# Patient Record
Sex: Male | Born: 2000 | Race: Black or African American | Hispanic: No | Marital: Single | Smoking: Never smoker
Health system: Southern US, Community
[De-identification: ages and names within clinical notes are randomized; demographics above are authoritative.]

## PROBLEM LIST (undated history)

## (undated) DIAGNOSIS — Q423 Congenital absence, atresia and stenosis of anus without fistula: Secondary | ICD-10-CM

## (undated) DIAGNOSIS — F909 Attention-deficit hyperactivity disorder, unspecified type: Secondary | ICD-10-CM

## (undated) DIAGNOSIS — L309 Dermatitis, unspecified: Secondary | ICD-10-CM

## (undated) HISTORY — PX: ANUS SURGERY: SHX302

---
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
## 2022-07-25 IMAGING — DX DG SHOULDER 2+V*L*
4 series · 4 of 4 positions shown · non-contrast
Comparison: None.

CLINICAL DATA: Intermittent left shoulder pain

EXAM:
LEFT SHOULDER - 2+ VIEW

[shoulder grashey]
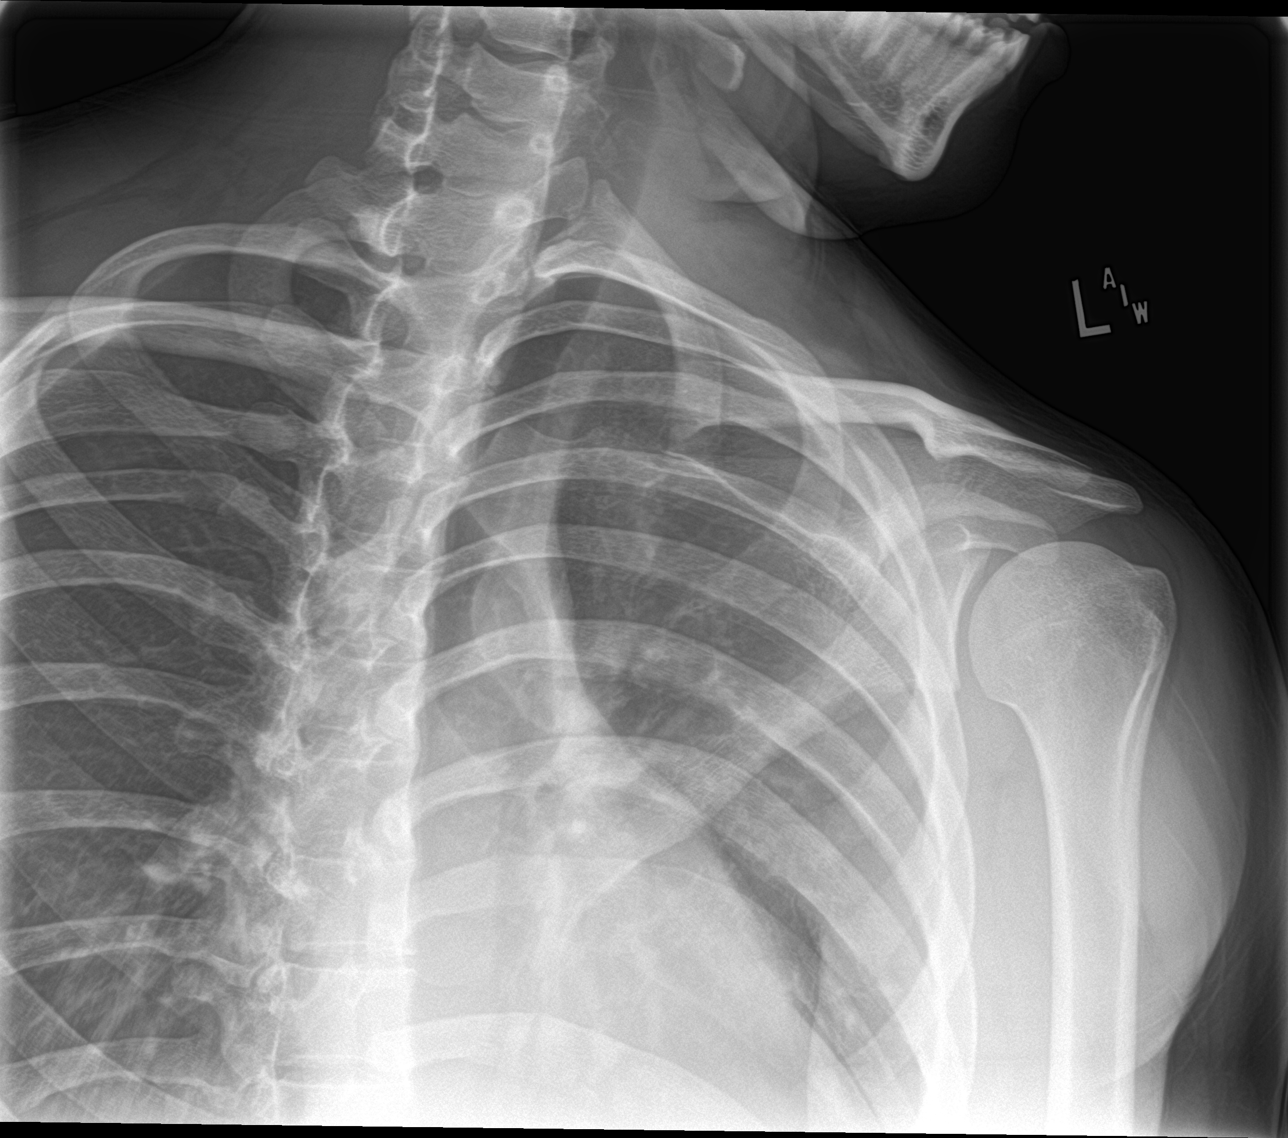

[shoulder y view]
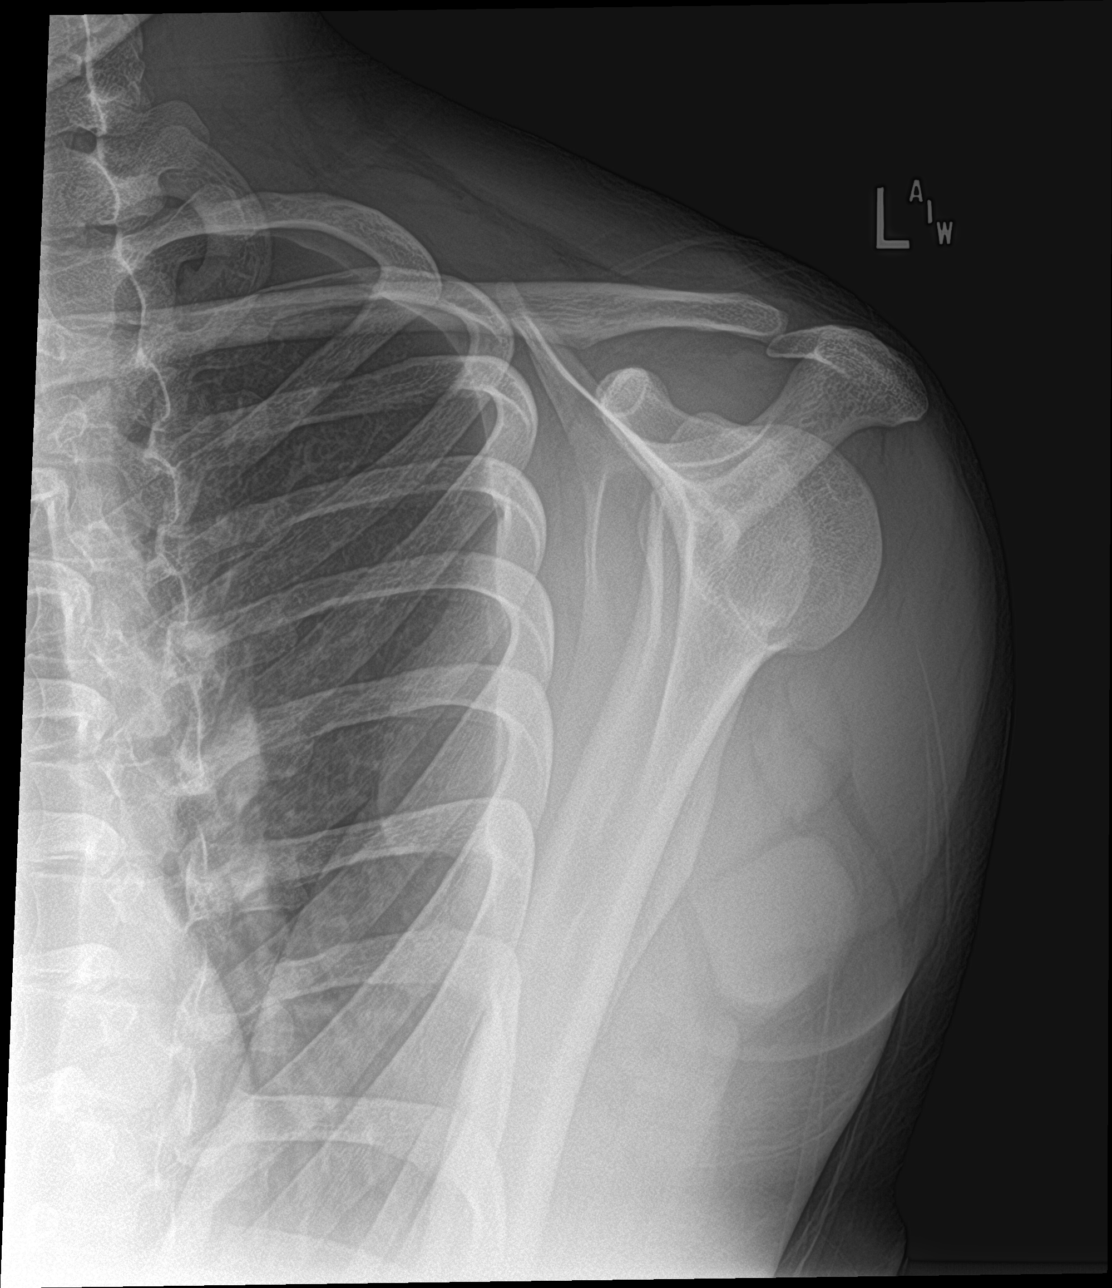

[shoulder axillary]
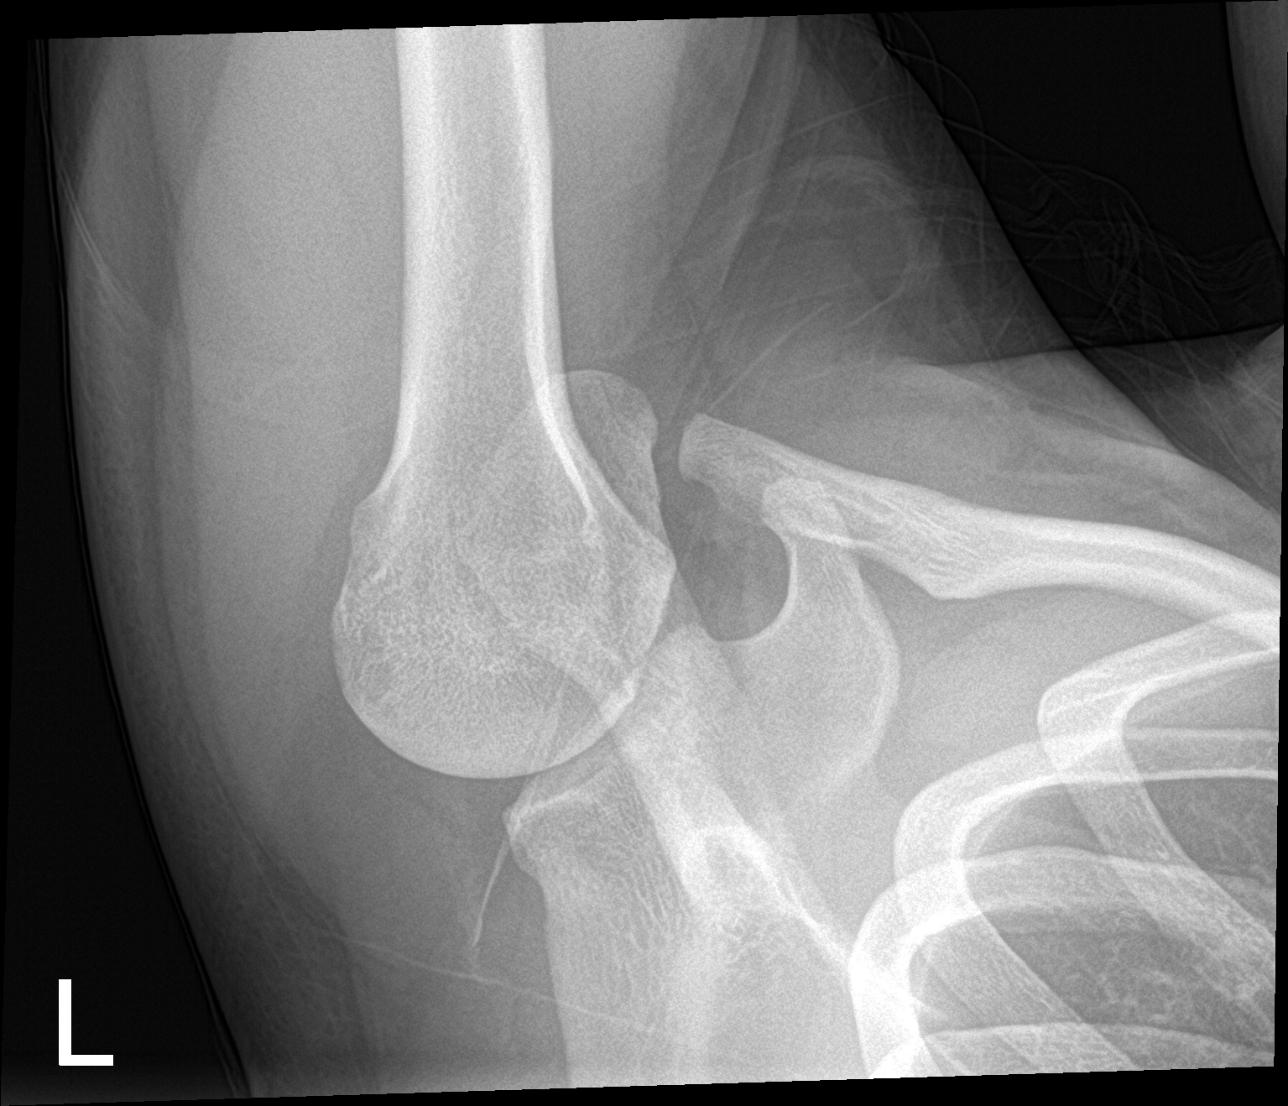

[shoulder ap neutral]
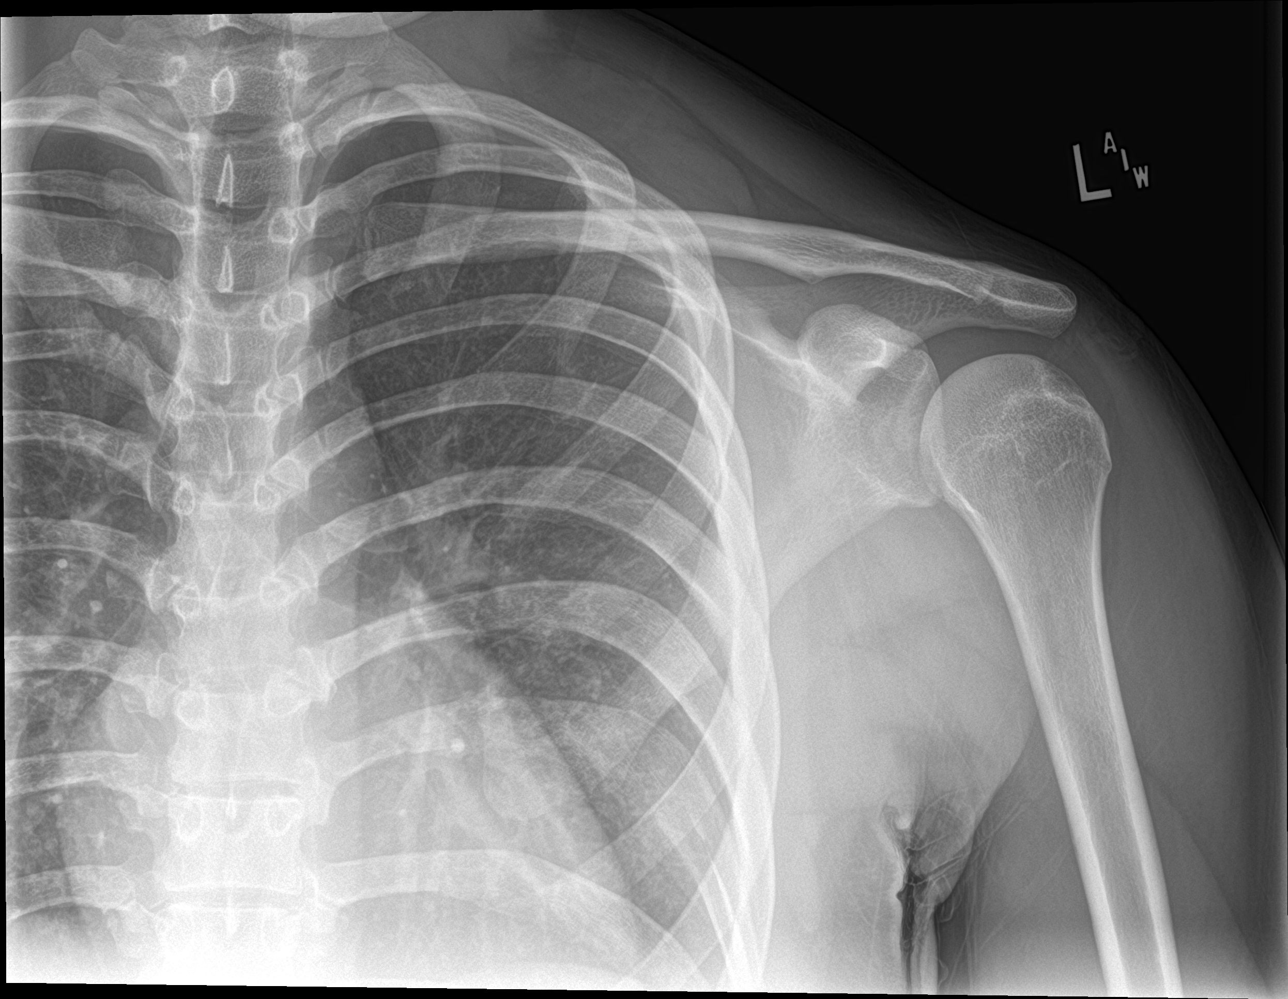

[4 of 4 positions shown; findings below may reference images not displayed]

FINDINGS: No fracture or dislocation is seen.

The joint spaces are preserved.

Visualized soft tissues are within normal limits.

Visualized lungs are clear.
IMPRESSION: Negative.

## 2022-09-20 ENCOUNTER — Other Ambulatory Visit: Payer: Self-pay

## 2022-09-20 ENCOUNTER — Ambulatory Visit
Admission: EM | Admit: 2022-09-20 | Discharge: 2022-09-20 | Disposition: A | Discharge status: Home / Self Care | Payer: Medicaid Other | Attending: Physician Assistant | Admitting: Physician Assistant

## 2022-09-20 DIAGNOSIS — N50812 Left testicular pain: Secondary | ICD-10-CM | POA: Insufficient documentation

## 2022-09-20 DIAGNOSIS — N453 Epididymo-orchitis: Secondary | ICD-10-CM | POA: Diagnosis present

## 2022-09-20 LAB — POCT URINALYSIS DIP (MANUAL ENTRY)
Bilirubin, UA: NEGATIVE
Glucose, UA: NEGATIVE mg/dL
Ketones, POC UA: NEGATIVE mg/dL
Nitrite, UA: NEGATIVE
Protein Ur, POC: NEGATIVE mg/dL
Spec Grav, UA: >=1.03 — AB (ref 1.010–1.025)
Urobilinogen, UA: 1 E.U./dL
pH, UA: 6 (ref 5.0–8.0)

## 2022-09-20 MED ORDER — DOXYCYCLINE HYCLATE 100 MG PO CAPS: 100.0000 mg | ORAL_CAPSULE | Freq: Two times a day (BID) | ORAL | 0 refills | Status: AC

## 2022-09-20 MED ORDER — CEFTRIAXONE SODIUM 1 G IJ SOLR
1.0000 g | Freq: Once | INTRAMUSCULAR | Status: AC
  Administered 2022-09-20: 1 g via INTRAMUSCULAR

## 2022-09-20 NOTE — Discharge Instructions (Signed)
As we discussed, the safest thing to do is to go to the emergency room where you can get imaging and make sure that there is not something dangerous going on.  Since you do not want to do that, we have ordered some lab work and we will contact you if this is abnormal.  We are treating you for an infection so please pick up doxycycline and take this as prescribed.  It is very important that if your symptoms are improving but if not gone away you should follow-up with urology; we have already scheduled an appointment for 09/22/2022 at 1045AM.  If your symptoms or not improving quickly or if anything worsens you must go to the emergency room as we discussed.

## 2022-09-20 NOTE — ED Provider Notes (Signed)
[facility]    CSN: [id number] Arrival date & time: 09/20/22  4098      History   Chief Complaint No chief complaint on file.   HPI Aaron Gamble is a 22 y.o. male.   Patient presents today with a 2-day history of left testicular pain and swelling.  Reports that symptoms have worsened prompting evaluation.  Currently pain is rated 8 on a 0-10 pain scale, described as aching, worse with prolonged standing, no alleviating factors identified.  Denies any penile discharge, genital lesion, abdominal pain, fever, nausea, vomiting.  Does report some urinary frequency.  He is sexually active but has no specific concern for STI.  Denies history of epididymitis.  Denies any recent antibiotics.  He has tried Tylenol and ibuprofen without improvement of symptoms.  Denies previous abdominal surgery or testicular surgery.  Denies episodes of similar symptoms in the past.  Denies any recent injury or heavy lifting before symptoms began.    Past Medical History:  Diagnosis Date   ADHD (attention deficit hyperactivity disorder)    Eczema    back of legs   Imperforate anus    surgery at birth    Patient Active Problem List   Diagnosis Date Noted   Behavior concern 03/11/2016   Gynecomastia 04/15/2015   Constipation 09/03/2013   ADHD (attention deficit hyperactivity disorder) 01/02/2012   Allergic rhinitis 02/17/2011   Asthma 07/27/2006   Atopic dermatitis 07/27/2006    Past Surgical History:  Procedure Laterality Date   ANUS SURGERY     ORIF FINGER FRACTURE  09/29/2011   Procedure: OPEN REDUCTION INTERNAL FIXATION (ORIF) METACARPAL (FINGER) FRACTURE;  Surgeon: Sharma Covert, MD;  Location: [facility];  Service: Orthopedics;  Laterality: Right;  right thumb open debridement, nail bed repair, fixation of thumb fracture       Home Medications    Prior to Admission medications   Medication Sig Start Date End Date Taking? Authorizing Provider  doxycycline  (VIBRAMYCIN) 100 MG capsule Take 1 capsule (100 mg total) by mouth 2 (two) times daily. 09/20/22  Yes Keondra Haydu K, PA-C  hydrocortisone 2.5 % ointment Apply 1 application topically 2 (two) times daily as needed. For eczema on his face 02/07/17   Latrelle Dodrill, MD  loratadine (CLARITIN) 10 MG tablet Take 1 tablet (10 mg total) by mouth daily as needed. During allergy season. 02/07/17   Latrelle Dodrill, MD  methylphenidate 36 MG PO CR tablet Take 1 tablet (36 mg total) by mouth daily. 02/07/17   Latrelle Dodrill, MD  triamcinolone ointment (KENALOG) 0.5 % Apply 1 application topically 2 (two) times daily as needed. 02/07/17   Latrelle Dodrill, MD    Family History Family History  Family history unknown: Yes    Social History Social History   Tobacco Use   Smoking status: Never    Passive exposure: Yes   Smokeless tobacco: Never     Allergies   Patient has no known allergies.   Review of Systems Review of Systems  Constitutional:  Positive for activity change. Negative for appetite change, fatigue and fever.  Gastrointestinal:  Negative for abdominal pain, diarrhea, nausea and vomiting.  Genitourinary:  Positive for frequency, scrotal swelling and testicular pain. Negative for dysuria, flank pain, penile swelling and urgency.     Physical Exam Triage Vital Signs ED Triage Vitals  Enc Vitals Group     BP 09/20/22 1211 121/68     Pulse Rate 09/20/22 1211 90  Resp 09/20/22 1211 18     Temp 09/20/22 1211 99.1 F (37.3 C)     Temp Source 09/20/22 1211 Oral     SpO2 09/20/22 1211 97 %     Weight --      Height --      Head Circumference --      Peak Flow --      Pain Score 09/20/22 1212 10     Pain Loc --      Pain Edu? --      Excl. in GC? --    No data found.  Updated Vital Signs BP 121/68 (BP Location: Right Arm)   Pulse 90   Temp 99.1 F (37.3 C) (Oral)   Resp 18   SpO2 97%   Visual Acuity Right Eye Distance:   Left Eye Distance:    Bilateral Distance:    Right Eye Near:   Left Eye Near:    Bilateral Near:     Physical Exam Vitals reviewed.  Constitutional:      General: He is awake.     Appearance: Normal appearance. He is well-developed. He is not ill-appearing.     Comments: Very pleasant male appears stated age obviously uncomfortable but in no acute distress  HENT:     Head: Normocephalic and atraumatic.     Mouth/Throat:     Pharynx: No oropharyngeal exudate, posterior oropharyngeal erythema or uvula swelling.  Cardiovascular:     Rate and Rhythm: Normal rate and regular rhythm.     Heart sounds: Normal heart sounds, S1 normal and S2 normal. No murmur heard. Pulmonary:     Effort: Pulmonary effort is normal.     Breath sounds: Normal breath sounds. No stridor. No wheezing, rhonchi or rales.     Comments: Clear to auscultation bilaterally Abdominal:     General: Bowel sounds are normal.     Palpations: Abdomen is soft.     Tenderness: There is no abdominal tenderness. There is no right CVA tenderness, left CVA tenderness, guarding or rebound.  Genitourinary:    Testes:        Right: Tenderness or swelling not present.        Left: Tenderness and swelling present.     Epididymis:     Right: Not inflamed.     Left: Inflamed.     Comments: Shanda Bumps, RN present as chaperone during exam.  Improvement with elevation of the scrotum. Neurological:     Mental Status: He is alert.  Psychiatric:        Behavior: Behavior is cooperative.      UC Treatments / Results  Labs (all labs ordered are listed, but only abnormal results are displayed) Labs Reviewed  POCT URINALYSIS DIP (MANUAL ENTRY) - Abnormal; Notable for the following components:      Result Value   Spec Grav, UA >=1.030 (*)    Blood, UA trace-intact (*)    Leukocytes, UA Small (1+) (*)    All other components within normal limits  URINE CULTURE  CBC WITH DIFFERENTIAL/PLATELET  BASIC METABOLIC PANEL  CYTOLOGY, (ORAL, ANAL, URETHRAL)  ANCILLARY ONLY    EKG   Radiology No results found.  Procedures Procedures (including critical care time)  Medications Ordered in UC Medications  cefTRIAXone (ROCEPHIN) injection 1 g (1 g Intramuscular Given 09/20/22 1244)    Initial Impression / Assessment and Plan / UC Course  I have reviewed the triage vital signs and the nursing notes.  Pertinent labs & imaging  results that were available during my care of the patient were reviewed by me and considered in my medical decision making (see chart for details).     Patient is well-appearing, afebrile, nontoxic, nontachycardic but obviously uncomfortable.  We discussed that the safest thing to do is to go to the emergency room since we do not have imaging capabilities in urgent care.  Patient declined to go to the ER so we will attempt outpatient follow-up.  I am concerned for epididymitis given he had improvement with elevation of testicles and symptoms have been present for 2 days making torsion less likely.  We did discuss that I cannot rule out torsion without an ultrasound and so the safest thing to do is to go to the emergency room but he continued to decline this.  Will empirically treat for epididymitis and was given an injection of Rocephin in clinic and started on doxycycline outpatient.  Urine did show leukocyte esterase but otherwise was normal.  Will send this for culture.  STI swab was collected and is pending.  CBC and BMP were obtained and are pending.  If he has significant leukocytosis or abnormal kidney function he would need to go to the emergency room.  I did call [facility] was able to speak to the triage nurse and schedule an appointment for 09/22/2022 at 10:45 AM.  I again discussed with patient that he should follow-up in the emergency room but he declined.  We discussed that it is very important that he takes all his medication follows up with specialist.  If he has any changing or worsening symptoms he should go  to the emergency room immediately to which she expressed understanding.  All questions were answered to patient's satisfaction.  Final Clinical Impressions(s) / UC Diagnoses   Final diagnoses:  Left testicular pain  Epididymo-orchitis     Discharge Instructions      As we discussed, the safest thing to do is to go to the emergency room where you can get imaging and make sure that there is not something dangerous going on.  Since you do not want to do that, we have ordered some lab work and we will contact you if this is abnormal.  We are treating you for an infection so please pick up doxycycline and take this as prescribed.  It is very important that if your symptoms are improving but if not gone away you should follow-up with urology; we have already scheduled an appointment for 09/22/2022 at 1045AM.  If your symptoms or not improving quickly or if anything worsens you must go to the emergency room as we discussed.     ED Prescriptions     Medication Sig Dispense Auth. Provider   doxycycline (VIBRAMYCIN) 100 MG capsule Take 1 capsule (100 mg total) by mouth 2 (two) times daily. 20 capsule Ovidio Steele, Noberto Retort, PA-C      PDMP not reviewed this encounter.   Jeani Hawking, PA-C 09/20/22 1310

## 2022-09-20 NOTE — ED Triage Notes (Signed)
Pt presents with left testicle pain and swelling X 2 days.

## 2022-09-21 ENCOUNTER — Emergency Department (HOSPITAL_COMMUNITY): Payer: Medicaid Other

## 2022-09-21 ENCOUNTER — Encounter (HOSPITAL_COMMUNITY): Payer: Self-pay | Admitting: Emergency Medicine

## 2022-09-21 ENCOUNTER — Emergency Department (HOSPITAL_COMMUNITY)
Admission: EM | Admit: 2022-09-21 | Discharge: 2022-09-21 | Disposition: A | Discharge status: Home / Self Care | Payer: Medicaid Other | Attending: Emergency Medicine | Admitting: Emergency Medicine

## 2022-09-21 ENCOUNTER — Other Ambulatory Visit: Payer: Self-pay

## 2022-09-21 DIAGNOSIS — N451 Epididymitis: Secondary | ICD-10-CM | POA: Diagnosis not present

## 2022-09-21 DIAGNOSIS — N50812 Left testicular pain: Secondary | ICD-10-CM | POA: Diagnosis present

## 2022-09-21 LAB — URINALYSIS, ROUTINE W REFLEX MICROSCOPIC
Bilirubin Urine: NEGATIVE
Glucose, UA: NEGATIVE mg/dL
Ketones, ur: NEGATIVE mg/dL
Nitrite: NEGATIVE
Protein, ur: 30 mg/dL — AB
Specific Gravity, Urine: 1.029 (ref 1.005–1.030)
WBC, UA: >50 WBC/hpf (ref 0–5)
pH: 5 (ref 5.0–8.0)

## 2022-09-21 LAB — CBC WITH DIFFERENTIAL/PLATELET
Abs Immature Granulocytes: 0.02 10*3/uL (ref 0.00–0.07)
Basophils Absolute: 0 10*3/uL (ref 0.0–0.2)
Basophils Absolute: 0 10*3/uL (ref 0.0–0.1)
Basophils Relative: 1 %
Basos: 0 %
EOS (ABSOLUTE): 0 10*3/uL (ref 0.0–0.4)
Eos: 0 %
Eosinophils Absolute: 0.1 10*3/uL (ref 0.0–0.5)
Eosinophils Relative: 1 %
HCT: 48.5 % (ref 39.0–52.0)
Hematocrit: 46.8 % (ref 37.5–51.0)
Hemoglobin: 16.3 g/dL (ref 13.0–17.7)
Hemoglobin: 16.6 g/dL (ref 13.0–17.0)
Immature Grans (Abs): 0 10*3/uL (ref 0.0–0.1)
Immature Granulocytes: 0 %
Immature Granulocytes: 0 %
Lymphocytes Absolute: 1.4 10*3/uL (ref 0.7–3.1)
Lymphocytes Relative: 24 %
Lymphs Abs: 1.8 10*3/uL (ref 0.7–4.0)
Lymphs: 15 %
MCH: 29.4 pg (ref 26.6–33.0)
MCH: 29.3 pg (ref 26.0–34.0)
MCHC: 34.8 g/dL (ref 31.5–35.7)
MCHC: 34.2 g/dL (ref 30.0–36.0)
MCV: 84 fL (ref 79–97)
MCV: 85.7 fL (ref 80.0–100.0)
Monocytes Absolute: 0.6 10*3/uL (ref 0.1–0.9)
Monocytes Absolute: 0.7 10*3/uL (ref 0.1–1.0)
Monocytes Relative: 9 %
Monocytes: 7 %
Neutro Abs: 5 10*3/uL (ref 1.7–7.7)
Neutrophils Absolute: 7.2 10*3/uL — ABNORMAL HIGH (ref 1.4–7.0)
Neutrophils Relative %: 65 %
Neutrophils: 78 %
Platelets: 194 10*3/uL (ref 150–450)
Platelets: 190 10*3/uL (ref 150–400)
RBC: 5.55 x10E6/uL (ref 4.14–5.80)
RBC: 5.66 MIL/uL (ref 4.22–5.81)
RDW: 12.4 % (ref 11.6–15.4)
RDW: 11.9 % (ref 11.5–15.5)
WBC: 9.3 10*3/uL (ref 3.4–10.8)
WBC: 7.5 10*3/uL (ref 4.0–10.5)
nRBC: 0 % (ref 0.0–0.2)

## 2022-09-21 LAB — BASIC METABOLIC PANEL
BUN/Creatinine Ratio: 9 (ref 9–20)
BUN: 9 mg/dL (ref 6–20)
CO2: 19 mmol/L — ABNORMAL LOW (ref 20–29)
Calcium: 10.3 mg/dL — ABNORMAL HIGH (ref 8.7–10.2)
Chloride: 103 mmol/L (ref 96–106)
Creatinine, Ser: 1 mg/dL (ref 0.76–1.27)
Glucose: 97 mg/dL (ref 70–99)
Potassium: 4.7 mmol/L (ref 3.5–5.2)
Sodium: 142 mmol/L (ref 134–144)
eGFR: 111 mL/min/{1.73_m2} (ref >59)

## 2022-09-21 LAB — COMPREHENSIVE METABOLIC PANEL
ALT: 96 U/L — ABNORMAL HIGH (ref 0–44)
AST: 82 U/L — ABNORMAL HIGH (ref 15–41)
Albumin: 4.2 g/dL (ref 3.5–5.0)
Alkaline Phosphatase: 70 U/L (ref 38–126)
Anion gap: 11 (ref 5–15)
BUN: 12 mg/dL (ref 6–20)
CO2: 24 mmol/L (ref 22–32)
Calcium: 9.9 mg/dL (ref 8.9–10.3)
Chloride: 100 mmol/L (ref 98–111)
Creatinine, Ser: 1.11 mg/dL (ref 0.61–1.24)
GFR, Estimated: >60 mL/min (ref >60)
Glucose, Bld: 89 mg/dL (ref 70–99)
Potassium: 4.2 mmol/L (ref 3.5–5.1)
Sodium: 135 mmol/L (ref 135–145)
Total Bilirubin: 1.9 mg/dL — ABNORMAL HIGH (ref 0.3–1.2)
Total Protein: 7.7 g/dL (ref 6.5–8.1)

## 2022-09-21 LAB — CYTOLOGY, (ORAL, ANAL, URETHRAL) ANCILLARY ONLY
Chlamydia: POSITIVE — AB
Comment: NEGATIVE
Comment: NEGATIVE
Comment: NORMAL
Neisseria Gonorrhea: POSITIVE — AB
Trichomonas: NEGATIVE

## 2022-09-21 LAB — URINE CULTURE: Culture: NO GROWTH

## 2022-09-21 MED ORDER — LIDOCAINE HCL (PF) 1 % IJ SOLN
1.0000 mL | Freq: Once | INTRAMUSCULAR | Status: AC
  Administered 2022-09-21: 1 mL
  Filled 2022-09-21: qty 5

## 2022-09-21 MED ORDER — NAPROXEN 250 MG PO TABS
500.0000 mg | ORAL_TABLET | Freq: Once | ORAL | Status: AC
  Administered 2022-09-21: 500 mg via ORAL
  Filled 2022-09-21: qty 2

## 2022-09-21 MED ORDER — LEVOFLOXACIN 500 MG PO TABS: 500.0000 mg | ORAL_TABLET | Freq: Every day | ORAL | 0 refills | Status: AC

## 2022-09-21 MED ORDER — CEFTRIAXONE SODIUM 500 MG IJ SOLR
500.0000 mg | Freq: Once | INTRAMUSCULAR | Status: AC
  Administered 2022-09-21: 500 mg via INTRAMUSCULAR
  Filled 2022-09-21: qty 500

## 2022-09-21 MED ORDER — NAPROXEN 500 MG PO TABS: 500.0000 mg | ORAL_TABLET | Freq: Two times a day (BID) | ORAL | 0 refills | Status: AC

## 2022-09-21 NOTE — Discharge Instructions (Addendum)
You were given an injection today in order to help treat your epididymitis.  You will also need to finish the course of antibiotics that were prescribed to you today.  Please take 1 tablet daily for the next 10 days.  You were also given pain medication, you will need to take 1 tablet twice a day with food for the next 7 days.

## 2022-09-21 NOTE — ED Provider Notes (Signed)
[place] EMERGENCY DEPARTMENT AT [facility] Provider Note   CSN: [id number] Arrival date & time: 09/21/22  4098     History  Chief Complaint  Patient presents with   Testicle Pain    Aaron Gamble is a 22 y.o. male.  22 year old male presents to the ED with a chief complaint of left testicular pain x 3 days. Patient was evaluated at [facility] yesterday sent to the ED for ultrasound. Reports pain to the area worsen with standing and walking. He has not taken any medication for improvement in symptoms.  He is also complaining of pain along his back and his lower abdomen.  He is currently sexually active, denies concern for any sexually-transmitted infection.  He denies any nausea, vomiting, urinary symptoms, penile discharge or drainage.  The history is provided by the patient and medical records.  Testicle Pain This is a new problem. The current episode started 2 days ago. The problem occurs constantly. The problem has not changed since onset.Associated symptoms include abdominal pain. Pertinent negatives include no chest pain and no shortness of breath. The symptoms are aggravated by walking. The symptoms are relieved by position. He has tried nothing for the symptoms.       Home Medications Prior to Admission medications   Medication Sig Start Date End Date Taking? Authorizing Provider  levofloxacin (LEVAQUIN) 500 MG tablet Take 1 tablet (500 mg total) by mouth daily for 10 days. 09/21/22 10/01/22 Yes Trissa Molina, PA-C  naproxen (NAPROSYN) 500 MG tablet Take 1 tablet (500 mg total) by mouth 2 (two) times daily for 7 days. 09/21/22 09/28/22 Yes Halli Equihua, Leonie Douglas, PA-C  doxycycline (VIBRAMYCIN) 100 MG capsule Take 1 capsule (100 mg total) by mouth 2 (two) times daily. 09/20/22   Raspet, Noberto Retort, PA-C  hydrocortisone 2.5 % ointment Apply 1 application topically 2 (two) times daily as needed. For eczema on his face 02/07/17   Latrelle Dodrill, MD  loratadine (CLARITIN) 10 MG tablet Take  1 tablet (10 mg total) by mouth daily as needed. During allergy season. 02/07/17   Latrelle Dodrill, MD  methylphenidate 36 MG PO CR tablet Take 1 tablet (36 mg total) by mouth daily. 02/07/17   Latrelle Dodrill, MD  triamcinolone ointment (KENALOG) 0.5 % Apply 1 application topically 2 (two) times daily as needed. 02/07/17   Latrelle Dodrill, MD      Allergies    Patient has no known allergies.    Review of Systems   Review of Systems  Constitutional:  Negative for chills and fever.  Respiratory:  Negative for shortness of breath.   Cardiovascular:  Negative for chest pain.  Gastrointestinal:  Positive for abdominal pain.  Genitourinary:  Positive for scrotal swelling and testicular pain. Negative for decreased urine volume, difficulty urinating, flank pain, penile pain and penile swelling.  All other systems reviewed and are negative.   Physical Exam Updated Vital Signs BP (!) 137/90   Pulse 91   Temp 98.6 F (37 C) (Oral)   Resp 17   Ht 6\' 2"  (1.88 m)   Wt 88.5 kg   SpO2 97%   BMI 25.04 kg/m  Physical Exam Vitals and nursing note reviewed.  Constitutional:      Appearance: Normal appearance.  HENT:     Head: Normocephalic and atraumatic.     Mouth/Throat:     Mouth: Mucous membranes are moist.  Eyes:     Pupils: Pupils are equal, round, and reactive to light.  Cardiovascular:  Rate and Rhythm: Normal rate.  Pulmonary:     Effort: Pulmonary effort is normal.  Abdominal:     General: Abdomen is flat.     Palpations: Abdomen is soft.     Tenderness: There is no right CVA tenderness or left CVA tenderness.  Genitourinary:    Comments: Deferred as patient placed on the hallway. Musculoskeletal:     Cervical back: Normal range of motion and neck supple.  Skin:    General: Skin is warm and dry.  Neurological:     Mental Status: He is oriented to person, place, and time.    ED Results / Procedures / Treatments   Labs (all labs ordered are listed, but  only abnormal results are displayed) Labs Reviewed  COMPREHENSIVE METABOLIC PANEL - Abnormal; Notable for the following components:      Result Value   AST 82 (*)    ALT 96 (*)    Total Bilirubin 1.9 (*)    All other components within normal limits  URINALYSIS, ROUTINE W REFLEX MICROSCOPIC - Abnormal; Notable for the following components:   APPearance HAZY (*)    Hgb urine dipstick SMALL (*)    Protein, ur 30 (*)    Leukocytes,Ua LARGE (*)    Bacteria, UA RARE (*)    All other components within normal limits  CBC WITH DIFFERENTIAL/PLATELET  GC/CHLAMYDIA PROBE AMP ([place]) NOT AT [facility]    EKG None  Radiology US SCROTUM W/DOPPLER  Result Date: 09/21/2022 CLINICAL DATA:  Pain EXAM: SCROTAL ULTRASOUND DOPPLER ULTRASOUND OF THE TESTICLES TECHNIQUE: Complete ultrasound examination of the testicles, epididymis, and other scrotal structures was performed. Color and spectral Doppler ultrasound were also utilized to evaluate blood flow to the testicles. COMPARISON:  None Available. FINDINGS: Right testicle Measurements: 4.9 x 2.8 x 3.1 cm. No mass or microlithiasis visualized. Left testicle Measurements: 4.2 x 3.2 x 3.1 cm. No mass or microlithiasis visualized. Right epididymis:  Normal in size and appearance. Left epididymis: Slightly heterogeneous enlarged epididymis with increased flow of the left epididymis compared to right Hydrocele:  Small hydroceles left-greater-than-right Varicocele:  None visualized. Pulsed Doppler interrogation of both testes demonstrates normal low resistance arterial and venous waveforms bilaterally. IMPRESSION: Enlarged hypervascular left epididymis with a hydrocele. Possible epididymitis. Electronically Signed   By: Karen Kays M.D.   On: 09/21/2022 10:06    Procedures Procedures    Medications Ordered in ED Medications  naproxen (NAPROSYN) tablet 500 mg (500 mg Oral Given 09/21/22 1406)  cefTRIAXone (ROCEPHIN) injection 500 mg (500 mg Intramuscular Given  09/21/22 1532)  lidocaine (PF) (XYLOCAINE) 1 % injection 1-2.1 mL (1 mL Other Given 09/21/22 1532)    ED Course/ Medical Decision Making/ A&P Clinical Course as of 09/21/22 1632  Wed Sep 21, 2022  1500 Leukocytes,Ua(!): LARGE [JS]  1500 WBC, UA: >50 [JS]    Clinical Course User Index [JS] Claude Manges, PA-C                             Medical Decision Making Amount and/or Complexity of Data Reviewed Labs: ordered. Decision-making details documented in ED Course. Radiology: ordered.  Risk Prescription drug management.    This patient presents to the ED for concern of testicle pain, this involves a number of treatment options, and is a complaint that carries with it a high risk of complications and morbidity.  The differential diagnosis includes torsion, epididymitis versus scrotal abscess.   Co morbidities: Discussed  in HPI   Brief History:  See HPI.   EMR reviewed including pt PMHx, past surgical history and past visits to ER.   See HPI for more details   Lab Tests:  I ordered and independently interpreted labs.  The pertinent results include:    I personally reviewed all laboratory work and imaging. Metabolic panel without any acute abnormality specifically kidney function within normal limits and no significant electrolyte abnormalities. CBC without leukocytosis or significant anemia.  UA with some large leukocytes greater than 50 white blood cell count.   Imaging Studies:  US Scrotum doppler: IMPRESSION:  Enlarged hypervascular left epididymis with a hydrocele. Possible  epididymitis.   Medicines ordered:  I ordered medication including naproxen  for pain control Reevaluation of the patient after these medicines showed that the patient improved I have reviewed the patients home medicines and have made adjustments as needed  Critical Interventions:   Given rocephin to cover him for gonorrhea.   Reevaluation:  After the interventions noted above I  re-evaluated patient and found that they have :improved  Social Determinants of Health:  The patient's social determinants of health were a factor in the care of this patient  Problem List / ED Course:  Patient presents to the ED with a chief complaint of left testicular pain has been ongoing for the last several days.  Evaluated urgent care yesterday and sent here for ultrasound of his testicles, however did defer this until today.  Reports pain along that area exacerbated with moving, walking somewhat improved with holding.  Denying any penile discharge, penile drainage.  No concern for sexually-transmitted infection, accompanied by significant other at the bedside.  Ultrasound was obtained in triage with did not show any signs of torsion, does show a small hydrocele along with some concerns for epididymitis.  Blood work is within normal limits, UA with some concern for superimposed UTI.  Treated here with IM Rocephin, will go home on a short course of Levaquin for the next 10 days.  Patient otherwise in stable condition accompanied by significant other at the time of discharge.  Dispostion:  After consideration of the diagnostic results and the patients response to treatment, I feel that the patent would benefit from treatment of    Portions of this note were generated with Dragon dictation software. Dictation errors may occur despite best attempts at proofreading.   Final Clinical Impression(s) / ED Diagnoses Final diagnoses:  Epididymitis    Rx / DC Orders ED Discharge Orders          Ordered    levofloxacin (LEVAQUIN) 500 MG tablet  Daily        09/21/22 1514    naproxen (NAPROSYN) 500 MG tablet  2 times daily        09/21/22 1514              Claude Manges, PA-C 09/21/22 1633    Horton, Clabe Seal, DO 09/22/22 [phone]

## 2022-09-21 NOTE — ED Triage Notes (Signed)
Pt. Stated, Ive had testicle pain and swelling for 3 days. Icant stand or walk because of the pain.

## 2024-05-15 ENCOUNTER — Other Ambulatory Visit: Payer: Self-pay | Admitting: Nurse Practitioner

## 2024-05-15 DIAGNOSIS — R634 Abnormal weight loss: Secondary | ICD-10-CM

## 2024-05-28 ENCOUNTER — Inpatient Hospital Stay
Admission: RE | Admit: 2024-05-28 | Discharge: 2024-05-28 | Disposition: A | Source: Ambulatory Visit | Attending: Nurse Practitioner | Admitting: Nurse Practitioner

## 2024-05-28 DIAGNOSIS — R634 Abnormal weight loss: Secondary | ICD-10-CM

## 2024-05-28 MED ORDER — IOPAMIDOL (ISOVUE-300) INJECTION 61%
100.0000 mL | Freq: Once | INTRAVENOUS | Status: AC | PRN
  Administered 2024-05-28: 100 mL via INTRAVENOUS
